# Patient Record
Sex: Male | Born: 1965 | Race: Black or African American | Hispanic: No | Marital: Married | Smoking: Current every day smoker
Health system: Southern US, Community
[De-identification: ages and names within clinical notes are randomized; demographics above are authoritative.]

## PROBLEM LIST (undated history)

## (undated) DIAGNOSIS — J45909 Unspecified asthma, uncomplicated: Secondary | ICD-10-CM

## (undated) DIAGNOSIS — E119 Type 2 diabetes mellitus without complications: Secondary | ICD-10-CM

---

## 2015-12-11 ENCOUNTER — Encounter: Payer: Self-pay | Admitting: Emergency Medicine

## 2015-12-11 ENCOUNTER — Emergency Department
Admission: EM | Admit: 2015-12-11 | Discharge: 2015-12-11 | Disposition: A | Discharge status: Home / Self Care | Payer: Self-pay | Attending: Emergency Medicine | Admitting: Emergency Medicine

## 2015-12-11 ENCOUNTER — Emergency Department: Payer: Self-pay

## 2015-12-11 DIAGNOSIS — J45901 Unspecified asthma with (acute) exacerbation: Secondary | ICD-10-CM | POA: Insufficient documentation

## 2015-12-11 DIAGNOSIS — F17210 Nicotine dependence, cigarettes, uncomplicated: Secondary | ICD-10-CM | POA: Insufficient documentation

## 2015-12-11 DIAGNOSIS — E119 Type 2 diabetes mellitus without complications: Secondary | ICD-10-CM | POA: Insufficient documentation

## 2015-12-11 HISTORY — DX: Unspecified asthma, uncomplicated: J45.909

## 2015-12-11 HISTORY — DX: Type 2 diabetes mellitus without complications: E11.9

## 2015-12-11 LAB — CBC WITH DIFFERENTIAL/PLATELET
BASOS PCT: 1 %
Basophils Absolute: 0 10*3/uL (ref 0–0.1)
Eosinophils Absolute: 0.7 10*3/uL (ref 0–0.7)
Eosinophils Relative: 10 %
HEMATOCRIT: 43.6 % (ref 40.0–52.0)
Hemoglobin: 14.5 g/dL (ref 13.0–18.0)
LYMPHS ABS: 2.9 10*3/uL (ref 1.0–3.6)
LYMPHS PCT: 41 %
MCH: 28.6 pg (ref 26.0–34.0)
MCHC: 33.4 g/dL (ref 32.0–36.0)
MCV: 85.9 fL (ref 80.0–100.0)
MONO ABS: 0.5 10*3/uL (ref 0.2–1.0)
MONOS PCT: 7 %
NEUTROS ABS: 2.8 10*3/uL (ref 1.4–6.5)
NEUTROS PCT: 41 %
Platelets: 198 10*3/uL (ref 150–440)
RBC: 5.08 MIL/uL (ref 4.40–5.90)
RDW: 14.4 % (ref 11.5–14.5)
WBC: 6.8 10*3/uL (ref 3.8–10.6)

## 2015-12-11 LAB — BASIC METABOLIC PANEL
ANION GAP: 7 (ref 5–15)
BUN: 14 mg/dL (ref 6–20)
CHLORIDE: 106 mmol/L (ref 101–111)
CO2: 27 mmol/L (ref 22–32)
Calcium: 9.3 mg/dL (ref 8.9–10.3)
Creatinine, Ser: 1.15 mg/dL (ref 0.61–1.24)
GFR calc Af Amer: >60 mL/min (ref >60)
GFR calc non Af Amer: >60 mL/min (ref >60)
GLUCOSE: 190 mg/dL — AB (ref 65–99)
POTASSIUM: 3.7 mmol/L (ref 3.5–5.1)
Sodium: 140 mmol/L (ref 135–145)

## 2015-12-11 LAB — TROPONIN I: Troponin I: <0.03 ng/mL (ref <0.03)

## 2015-12-11 LAB — BRAIN NATRIURETIC PEPTIDE: B NATRIURETIC PEPTIDE 5: 10 pg/mL (ref 0.0–100.0)

## 2015-12-11 MED ORDER — FLUTICASONE-SALMETEROL 230-21 MCG/ACT IN AERO: 2.0000 | INHALATION_SPRAY | Freq: Two times a day (BID) | RESPIRATORY_TRACT | 0 refills | Status: AC

## 2015-12-11 MED ORDER — PREDNISONE 20 MG PO TABS
40.0000 mg | ORAL_TABLET | Freq: Once | ORAL | Status: AC
  Administered 2015-12-11: 40 mg via ORAL
  Filled 2015-12-11: qty 2

## 2015-12-11 MED ORDER — PREDNISONE 20 MG PO TABS: 40.0000 mg | ORAL_TABLET | Freq: Every day | ORAL | 0 refills | Status: AC

## 2015-12-11 MED ORDER — IPRATROPIUM-ALBUTEROL 0.5-2.5 (3) MG/3ML IN SOLN
3.0000 mL | Freq: Once | RESPIRATORY_TRACT | Status: AC
  Administered 2015-12-11: 3 mL via RESPIRATORY_TRACT
  Filled 2015-12-11: qty 3

## 2015-12-11 MED ORDER — ALBUTEROL SULFATE HFA 108 (90 BASE) MCG/ACT IN AERS: 2.0000 | INHALATION_SPRAY | Freq: Four times a day (QID) | RESPIRATORY_TRACT | 0 refills | Status: AC | PRN

## 2015-12-11 NOTE — ED Notes (Signed)
Pt resting in bed with SO at bedside. Pt awakens with mild stimuli, requesting something to drink, this RN instructed patient that we would wait until all lab results were back. Pt states understanding at this time.

## 2015-12-11 NOTE — ED Triage Notes (Signed)
Pt presents to ED with c/o intermittent shortness of breath and asthma attacks x 1 week with worsening today. Per EMS pt had 1 albuterol treatment prior to their arrival, had 125mg  solumedrol en route, and 1 duoneb en route. Pt presents 95% on 2L with a 20G to R forearm. Pt has a hx of asthma and diabetes.

## 2015-12-11 NOTE — ED Provider Notes (Signed)
[facility] Emergency Department Provider Note    ____________________________________________   I have reviewed the triage vital signs and the nursing notes.   HISTORY  Chief Complaint Shortness of Breath   History limited by: Not Limited   HPI Logan Barker is a 50 y.o. male who presents to the emergency department today because of concerns for shortness of breath. Patient states he does have a history of asthma. He states that he has been out of his asthma medication for the past week. He states for the past 2-3 days he has been having increasing shortness of breath. He has had associated cough.He denies any chest pain. He denies any fevers.   Past Medical History:  Diagnosis Date  . Asthma   . Diabetes mellitus without complication (HCC)     There are no active problems to display for this patient.   History reviewed. No pertinent surgical history.  Prior to Admission medications   Medication Sig Start Date End Date Taking? Authorizing Provider  albuterol (PROVENTIL HFA;VENTOLIN HFA) 108 (90 Base) MCG/ACT inhaler Inhale 2 puffs into the lungs every 6 (six) hours as needed for wheezing or shortness of breath. 12/11/15   Phineas SemenGraydon Sera Hitsman, MD  fluticasone-salmeterol (ADVAIR HFA) 230-21 MCG/ACT inhaler Inhale 2 puffs into the lungs 2 (two) times daily. 12/11/15   Phineas SemenGraydon Zaina Jenkin, MD  predniSONE (DELTASONE) 20 MG tablet Take 2 tablets (40 mg total) by mouth daily. 12/11/15   Phineas SemenGraydon Camar Guyton, MD    Allergies Review of patient's allergies indicates no known allergies.  History reviewed. No pertinent family history.  Social History Social History  Substance Use Topics  . Smoking status: Current Every Day Smoker    Packs/day: 1.00    Types: Cigarettes  . Smokeless tobacco: Never Used  . Alcohol use Yes     Comment: occassional    Review of Systems  Constitutional: Negative for fever. Cardiovascular: Negative for chest pain. Respiratory: Positive  for shortness of breath. Gastrointestinal: Negative for abdominal pain, vomiting and diarrhea. Neurological: Negative for headaches, focal weakness or numbness.  10-point ROS otherwise negative.  ____________________________________________   PHYSICAL EXAM:  VITAL SIGNS: ED Triage Vitals  Enc Vitals Group     BP 12/11/15 2054 112/78     Pulse Rate 12/11/15 2054 88     Resp 12/11/15 2054 (!) 22     Temp 12/11/15 2054 98.7 F (37.1 C)     Temp Source 12/11/15 2054 Oral     SpO2 12/11/15 2052 95 %     Weight 12/11/15 2055 240 lb (108.9 kg)     Height 12/11/15 2055 6' 1.5" (1.867 m)     Head Circumference --      Peak Flow --      Pain Score 12/11/15 2055 8   Constitutional: Alert and oriented. Well appearing and in no distress. Eyes: Conjunctivae are normal. Normal extraocular movements. ENT   Head: Normocephalic and atraumatic.   Nose: No congestion/rhinnorhea.   Mouth/Throat: Mucous membranes are moist.   Neck: No stridor. Hematological/Lymphatic/Immunilogical: No cervical lymphadenopathy. Cardiovascular: Normal rate, regular rhythm.  No murmurs, rubs, or gallops. Respiratory: Normal respiratory effort without tachypnea nor retractions. Diffuse expiratory wheezing. Gastrointestinal: Soft and nontender. No distention.  Genitourinary: Deferred Musculoskeletal: Normal range of motion in all extremities. No lower extremity edema. Neurologic:  Normal speech and language. No gross focal neurologic deficits are appreciated.  Skin:  Skin is warm, dry and intact. No rash noted. Psychiatric: Mood and affect are normal. Speech and  behavior are normal. Patient exhibits appropriate insight and judgment.  ____________________________________________    LABS (pertinent positives/negatives)  Labs Reviewed  BASIC METABOLIC PANEL - Abnormal; Notable for the following:       Result Value   Glucose, Bld 190 (*)    All other components within normal limits  BRAIN  NATRIURETIC PEPTIDE  TROPONIN I  CBC WITH DIFFERENTIAL/PLATELET     ____________________________________________   EKG  I, Phineas Semen, attending physician, personally viewed and interpreted this EKG  EKG Time: 2044 Rate: 88 Rhythm: normal sinus rhythm Axis: normal Intervals: qtc 435 QRS: narrow ST changes: no st elevation Impression: normal ekg   ____________________________________________    RADIOLOGY  CXR IMPRESSION:  No acute cardiopulmonary abnormality.    ____________________________________________   PROCEDURES  Procedures  ____________________________________________   INITIAL IMPRESSION / ASSESSMENT AND PLAN / ED COURSE  Pertinent labs & imaging results that were available during my care of the patient were reviewed by me and considered in my medical decision making (see chart for details).  Patient presents to the emergency department today because of concerns for shortness breath. Patient does have a history of asthma and is been off his meds for a week. He states that he did feel better after DuoNeb treatment. Will give steroids further DuoNeb's and likely discharge with steroids and home inhaler medication prescriptions ____________________________________________   FINAL CLINICAL IMPRESSION(S) / ED DIAGNOSES  Final diagnoses:  Asthma exacerbation     Note: This dictation was prepared with Dragon dictation. Any transcriptional errors that result from this process are unintentional    Phineas Semen, MD 12/11/15 2352

## 2015-12-11 NOTE — Discharge Instructions (Signed)
Please seek medical attention for any high fevers, chest pain, shortness of breath, change in behavior, persistent vomiting, bloody stool or any other new or concerning symptoms.  

## 2015-12-11 NOTE — ED Notes (Signed)
Report given to Rebecca, RN

## 2018-01-28 IMAGING — DX DG CHEST 1V PORT
2 series · 2 of 2 positions shown · non-contrast
Comparison: None.

CLINICAL DATA: 50-year-old male with cough and shortness of breath
for 2 days. Posterior neck pain. Initial encounter. Smoker.

EXAM:
PORTABLE CHEST 1 VIEW

[chest ap (1 of 2)]
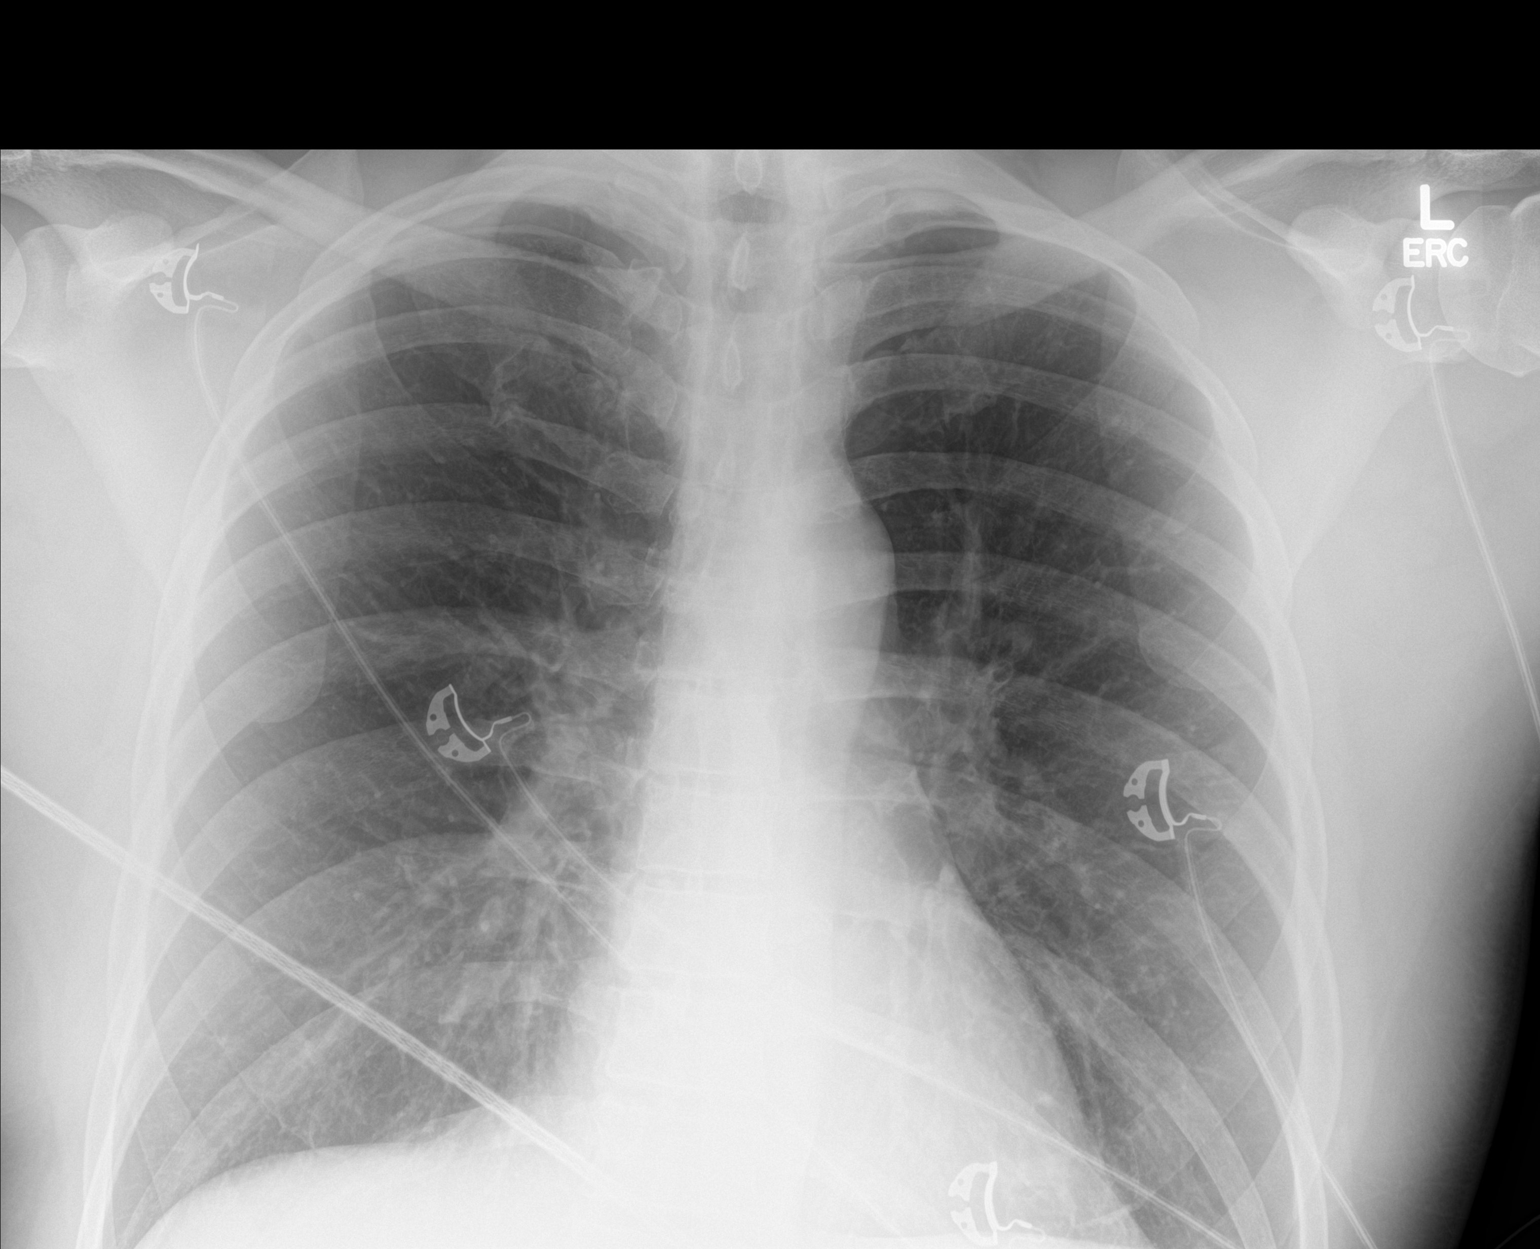

[chest ap (2 of 2)]
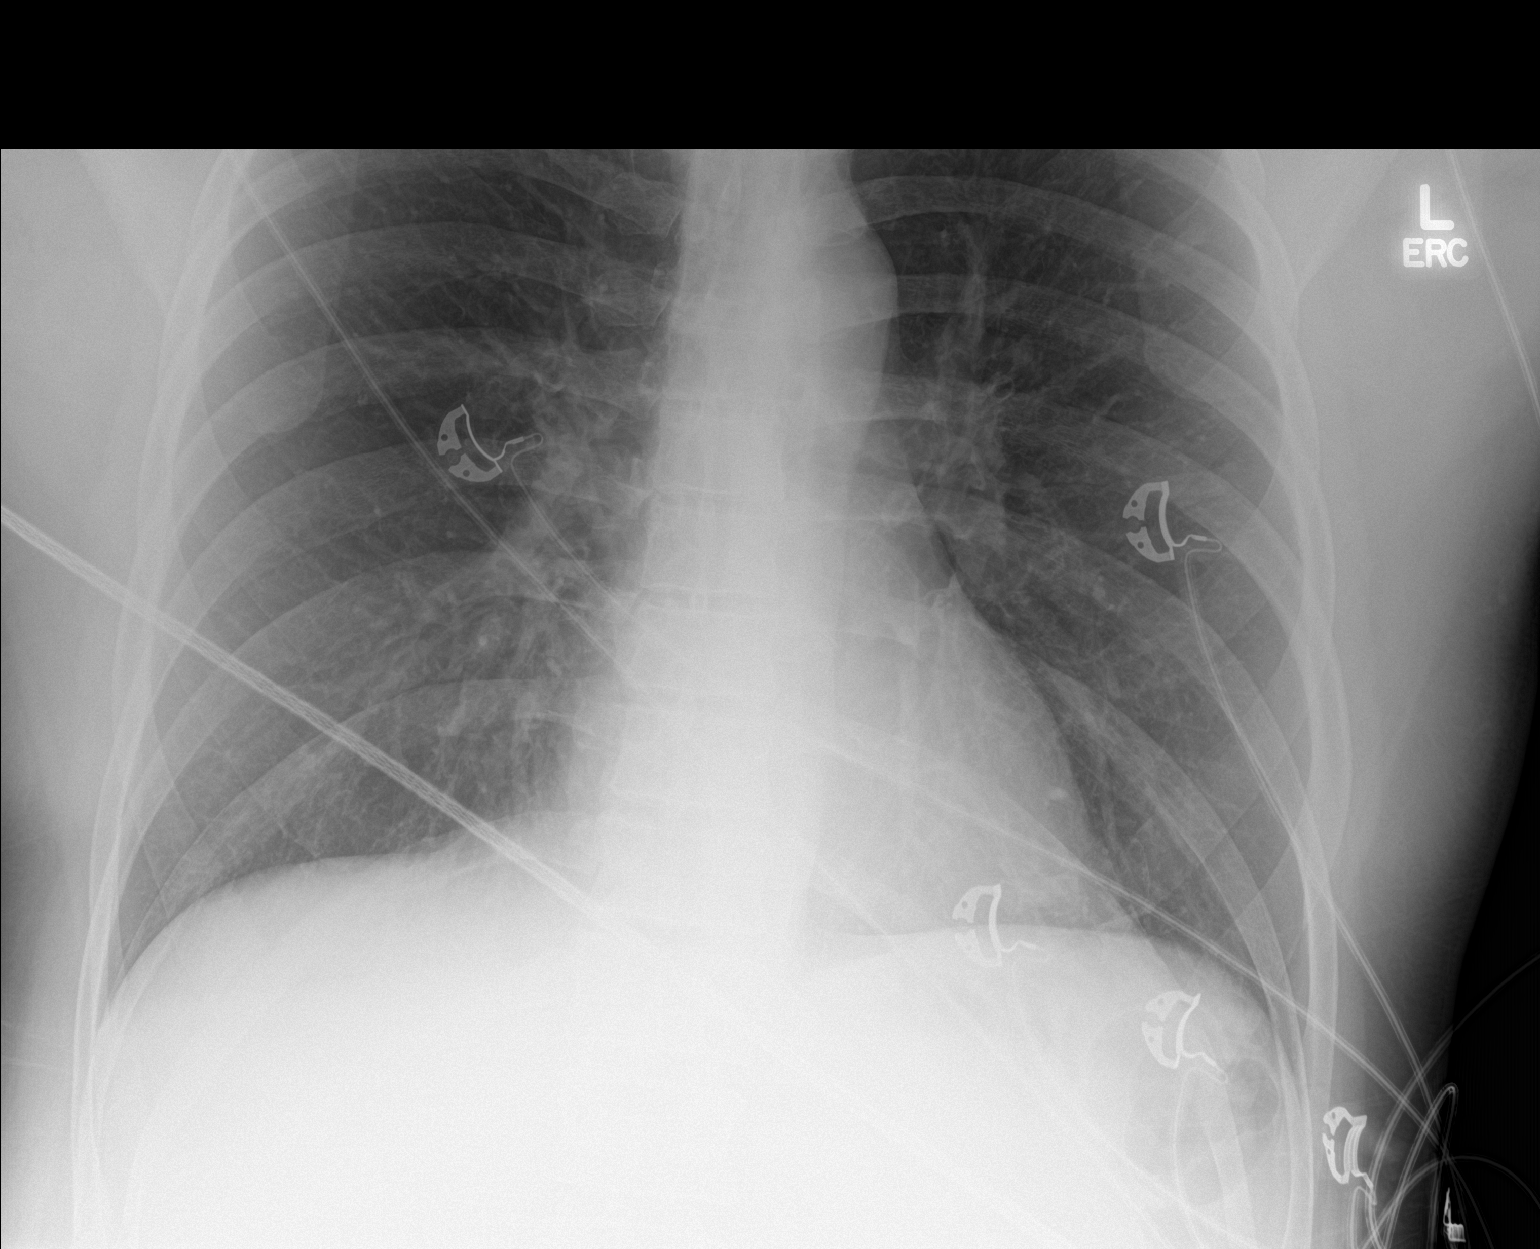

[2 of 2 positions shown; findings below may reference images not displayed]

FINDINGS: Portable AP upright view at 3233 hours. Lung volumes at the upper
limits of normal to hyperinflated. Normal cardiac size and
mediastinal contours. Visualized tracheal air column is within
normal limits. Allowing for portable technique the lungs are clear.
No pneumothorax or pleural effusion.

No acute osseous abnormality identified.
IMPRESSION: No acute cardiopulmonary abnormality.
